# Patient Record
Sex: Male | Born: 2000 | Race: Black or African American | Hispanic: No | Marital: Single | Smoking: Never smoker
Health system: Southern US, Community
[De-identification: ages and names within clinical notes are randomized; demographics above are authoritative.]

## PROBLEM LIST (undated history)

## (undated) DIAGNOSIS — E559 Vitamin D deficiency, unspecified: Secondary | ICD-10-CM

## (undated) HISTORY — PX: FOOT SURGERY: SHX648

## (undated) HISTORY — PX: CLUB FOOT RELEASE: SHX1363

## (undated) HISTORY — PX: HERNIA REPAIR: SHX51

---
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
## 2021-05-01 ENCOUNTER — Emergency Department (HOSPITAL_BASED_OUTPATIENT_CLINIC_OR_DEPARTMENT_OTHER)
Admission: EM | Admit: 2021-05-01 | Discharge: 2021-05-01 | Disposition: A | Discharge status: Home / Self Care | Payer: 59 | Attending: Emergency Medicine | Admitting: Emergency Medicine

## 2021-05-01 ENCOUNTER — Other Ambulatory Visit: Payer: Self-pay

## 2021-05-01 ENCOUNTER — Emergency Department (HOSPITAL_BASED_OUTPATIENT_CLINIC_OR_DEPARTMENT_OTHER): Payer: 59 | Admitting: Radiology

## 2021-05-01 ENCOUNTER — Encounter (HOSPITAL_BASED_OUTPATIENT_CLINIC_OR_DEPARTMENT_OTHER): Payer: Self-pay

## 2021-05-01 DIAGNOSIS — M545 Low back pain, unspecified: Secondary | ICD-10-CM | POA: Insufficient documentation

## 2021-05-01 DIAGNOSIS — M546 Pain in thoracic spine: Secondary | ICD-10-CM | POA: Diagnosis not present

## 2021-05-01 DIAGNOSIS — Y990 Civilian activity done for income or pay: Secondary | ICD-10-CM | POA: Diagnosis not present

## 2021-05-01 DIAGNOSIS — J45909 Unspecified asthma, uncomplicated: Secondary | ICD-10-CM | POA: Diagnosis not present

## 2021-05-01 DIAGNOSIS — W228XXA Striking against or struck by other objects, initial encounter: Secondary | ICD-10-CM | POA: Diagnosis not present

## 2021-05-01 MED ORDER — KETOROLAC TROMETHAMINE 60 MG/2ML IM SOLN
60.0000 mg | Freq: Once | INTRAMUSCULAR | Status: AC
  Administered 2021-05-01: 60 mg via INTRAMUSCULAR
  Filled 2021-05-01: qty 2

## 2021-05-01 NOTE — ED Triage Notes (Signed)
Pt reports hitting his back on a metal pole yesterday. Woke up in the middle of the night with back tightness. Pt reports mid back pain

## 2021-05-01 NOTE — ED Provider Notes (Signed)
[facility] EMERGENCY DEPT Provider Note   CSN: [id number] Arrival date & time: 05/01/21  1827     History Chief Complaint  Patient presents with   Back Pain    Joshua Best is a 20 y.o. male.  The history is provided by the patient.  Back Pain Location:  Thoracic spine Quality:  Aching Radiates to:  Does not radiate Pain severity:  Mild Onset quality:  Gradual Duration:  1 day Timing:  Intermittent Progression:  Waxing and waning Chronicity:  New Context comment:  Hit back on a metal pole at work yesterday Relieved by:  Nothing Worsened by:  Nothing Associated symptoms: no abdominal pain, no abdominal swelling, no bladder incontinence, no bowel incontinence, no chest pain, no dysuria, no fever, no headaches, no leg pain, no numbness, no paresthesias, no pelvic pain, no perianal numbness, no tingling, no weakness and no weight loss       Past Medical History:  Diagnosis Date   Asthma    Attention deficit disorder    Vitamin D deficiency     There are no problems to display for this patient.   Past Surgical History:  Procedure Laterality Date   CLUB FOOT RELEASE     FOOT SURGERY     HERNIA REPAIR         Family History  Problem Relation Age of Onset   Healthy Mother    Sickle cell trait Father    Healthy Father     Social History   Tobacco Use   Smoking status: Never   Smokeless tobacco: Never  Vaping Use   Vaping Use: Never used  Substance Use Topics   Alcohol use: No   Drug use: No    Home Medications Prior to Admission medications   Not on File    Allergies    Patient has no known allergies.  Review of Systems   Review of Systems  Constitutional:  Negative for fever and weight loss.  Cardiovascular:  Negative for chest pain.  Gastrointestinal:  Negative for abdominal pain and bowel incontinence.  Genitourinary:  Negative for bladder incontinence, dysuria and pelvic pain.  Musculoskeletal:  Positive for back pain.  Negative for arthralgias.  Skin:  Negative for color change and pallor.  Neurological:  Negative for dizziness, tingling, tremors, seizures, syncope, facial asymmetry, speech difficulty, weakness, light-headedness, numbness, headaches and paresthesias.   Physical Exam Updated Vital Signs BP (!) 143/93 (BP Location: Right Arm)   Pulse 87   Temp 98.6 F (37 C) (Oral)   Resp 16   SpO2 98%   Physical Exam Vitals and nursing note reviewed.  Constitutional:      General: He is not in acute distress.    Appearance: He is well-developed. He is not ill-appearing.  HENT:     Head: Normocephalic and atraumatic.  Cardiovascular:     Rate and Rhythm: Normal rate and regular rhythm.     Pulses: Normal pulses.     Heart sounds: No murmur heard. Pulmonary:     Effort: Pulmonary effort is normal. No respiratory distress.     Breath sounds: Normal breath sounds.  Abdominal:     Palpations: Abdomen is soft.     Tenderness: There is no abdominal tenderness.  Musculoskeletal:        General: Tenderness (TTP to parapsinal mid thoracic muscles, no midline spinal tenderness) present. No swelling.     Cervical back: Normal range of motion and neck supple. No tenderness.  Skin:  General: Skin is warm and dry.     Capillary Refill: Capillary refill takes less than 2 seconds.  Neurological:     General: No focal deficit present.     Mental Status: He is alert and oriented to person, place, and time.     Cranial Nerves: No cranial nerve deficit.     Sensory: No sensory deficit.     Motor: No weakness.    ED Results / Procedures / Treatments   Labs (all labs ordered are listed, but only abnormal results are displayed) Labs Reviewed - No data to display  EKG None  Radiology DG Thoracic Spine 2 View  Result Date: 05/01/2021 CLINICAL DATA:  Pain.  Hit back no evidence on metal pole yesterday. EXAM: THORACIC SPINE 2 VIEWS COMPARISON:  None. FINDINGS: The alignment is maintained. Vertebral  body heights are maintained. Of fracture or focal bone abnormality. No significant disc space narrowing. Posterior elements appear intact. There is no paravertebral soft tissue abnormality. IMPRESSION: Negative radiographs of the thoracic spine. Electronically Signed   By: Narda Rutherford M.D.   On: 05/01/2021 19:23    Procedures Procedures   Medications Ordered in ED Medications  ketorolac (TORADOL) injection 60 mg (has no administration in time range)    ED Course  I have reviewed the triage vital signs and the nursing notes.  Pertinent labs & imaging results that were available during my care of the patient were reviewed by me and considered in my medical decision making (see chart for details).    MDM Rules/Calculators/A&P                           [name] is here with mid back pain after hitting his back on a metal pole at work.  X-ray negative for fracture.  This seems to maybe be a muscle contusion or spasm.  Recommend Tylenol and ibuprofen.  Very well-appearing.  No midline spinal pain.  Discharged in good condition.  This chart was dictated using voice recognition software.  Despite best efforts to proofread,  errors can occur which can change the documentation meaning.   Final Clinical Impression(s) / ED Diagnoses Final diagnoses:  Acute low back pain without sciatica, unspecified back pain laterality    Rx / DC Orders ED Discharge Orders     None        Virgina Norfolk, DO 05/01/21 1928

## 2021-05-01 NOTE — Discharge Instructions (Signed)
X-ray showed no fracture.  Overall suspect a bone bruise.  Recommend taking 1000 mg of Tylenol every 6 hours as needed for pain.  Recommend taking 800 mg ibuprofen every 8 hours as needed for pain

## 2021-05-14 ENCOUNTER — Other Ambulatory Visit: Payer: Self-pay

## 2021-05-14 ENCOUNTER — Encounter (HOSPITAL_BASED_OUTPATIENT_CLINIC_OR_DEPARTMENT_OTHER): Payer: Self-pay | Admitting: Emergency Medicine

## 2021-05-14 ENCOUNTER — Emergency Department (HOSPITAL_BASED_OUTPATIENT_CLINIC_OR_DEPARTMENT_OTHER)
Admission: EM | Admit: 2021-05-14 | Discharge: 2021-05-14 | Disposition: A | Discharge status: Home / Self Care | Payer: 59 | Attending: Emergency Medicine | Admitting: Emergency Medicine

## 2021-05-14 DIAGNOSIS — R109 Unspecified abdominal pain: Secondary | ICD-10-CM | POA: Insufficient documentation

## 2021-05-14 DIAGNOSIS — J45909 Unspecified asthma, uncomplicated: Secondary | ICD-10-CM | POA: Diagnosis not present

## 2021-05-14 DIAGNOSIS — R531 Weakness: Secondary | ICD-10-CM | POA: Insufficient documentation

## 2021-05-14 DIAGNOSIS — R1110 Vomiting, unspecified: Secondary | ICD-10-CM | POA: Diagnosis not present

## 2021-05-14 DIAGNOSIS — K2900 Acute gastritis without bleeding: Secondary | ICD-10-CM

## 2021-05-14 MED ORDER — ONDANSETRON 4 MG PO TBDP: 4.0000 mg | ORAL_TABLET | Freq: Three times a day (TID) | ORAL | 0 refills | Status: DC | PRN

## 2021-05-14 MED ORDER — ONDANSETRON 4 MG PO TBDP
4.0000 mg | ORAL_TABLET | Freq: Once | ORAL | Status: AC
  Administered 2021-05-14: 4 mg via ORAL
  Filled 2021-05-14: qty 1

## 2021-05-14 NOTE — ED Provider Notes (Signed)
[facility] EMERGENCY DEPARTMENT Provider Note  CSN: [id number] Arrival date & time: 05/14/21 1821    History Chief Complaint  Patient presents with  . Emesis    Joshua Best is a 20 y.o. male who works at [employer], reports he ate two sausage biscuits around lunchtime yesterday and began having some abdominal discomfort shortly after. He went home to take a nap and woke up vomiting around midnight last night. He reports continued vomiting through the night but improved during the day today. He has not tried eating or drinking much and still feels generally weak. No more abdominal pain. No fever or diarrhea.    Past Medical History:  Diagnosis Date  . Asthma   . Attention deficit disorder   . Vitamin D deficiency     Past Surgical History:  Procedure Laterality Date  . CLUB FOOT RELEASE    . FOOT SURGERY    . HERNIA REPAIR      Family History  Problem Relation Age of Onset  . Healthy Mother   . Sickle cell trait Father   . Healthy Father     Social History   Tobacco Use  . Smoking status: Never  . Smokeless tobacco: Never  Vaping Use  . Vaping Use: Never used  Substance Use Topics  . Alcohol use: No  . Drug use: No     Home Medications Prior to Admission medications   Not on File     Allergies    Patient has no known allergies.   Review of Systems   Review of Systems A comprehensive review of systems was completed and negative except as noted in HPI.     Physical Exam BP 123/63 (BP Location: Right Arm)   Pulse (!) 58   Temp 98.4 F (36.9 C)   Resp 16   Ht 5\' 8"  (1.727 m)   Wt 86.2 kg   SpO2 100%   BMI 28.89 kg/m   Physical Exam Vitals and nursing note reviewed.  Constitutional:      Appearance: Normal appearance.  HENT:     Head: Normocephalic and atraumatic.     Nose: Nose normal.     Mouth/Throat:     Mouth: Mucous membranes are moist.  Eyes:     Extraocular Movements: Extraocular movements intact.      Conjunctiva/sclera: Conjunctivae normal.  Cardiovascular:     Rate and Rhythm: Normal rate.  Pulmonary:     Effort: Pulmonary effort is normal.     Breath sounds: Normal breath sounds.  Abdominal:     General: Abdomen is flat.     Palpations: Abdomen is soft.     Tenderness: There is no abdominal tenderness.  Musculoskeletal:        General: No swelling. Normal range of motion.     Cervical back: Neck supple.  Skin:    General: Skin is warm and dry.  Neurological:     General: No focal deficit present.     Mental Status: He is alert.  Psychiatric:        Mood and Affect: Mood normal.     ED Results / Procedures / Treatments   Labs (all labs ordered are listed, but only abnormal results are displayed) Labs Reviewed - No data to display  EKG None   Radiology No results found.  Procedures Procedures  Medications Ordered in the ED Medications  ondansetron (ZOFRAN-ODT) disintegrating tablet 4 mg (4 mg Oral Given 05/14/21 2200)     MDM  Rules/Calculators/A&P MDM Patient with benign exam, not currently vomiting. Will give Zofran and PO trial.   ED Course  I have reviewed the triage vital signs and the nursing notes.  Pertinent labs & imaging results that were available during my care of the patient were reviewed by me and considered in my medical decision making (see chart for details).  Clinical Course as of 05/14/21 2324  Mon May 14, 2021  2251 Patient feeling better and wants to go home. Rx for Zofran, work note and PCP follow up.   [CS]    Clinical Course User Index [CS] Pollyann Savoy, MD    Final Clinical Impression(s) / ED Diagnoses Final diagnoses:  None    Rx / DC Orders ED Discharge Orders     None        Pollyann Savoy, MD 05/14/21 2324

## 2021-05-14 NOTE — ED Triage Notes (Incomplete)
Pt arrive sto ED with c/o emesis. Pt reports that he ate two sausage biscuits yesterday afternoon and just two hours later started throwing up. Pt reports he has vomited five times since yesterday afternoon. He states his stomach is "queezy" but not painful. Pt denies diarrhea. No fevers, chills. Vomit is brown tinged.

## 2021-06-12 ENCOUNTER — Ambulatory Visit: Payer: Medicaid Other | Admitting: Physician Assistant

## 2021-07-03 ENCOUNTER — Ambulatory Visit: Payer: Medicaid Other | Admitting: Physician Assistant

## 2021-08-09 ENCOUNTER — Ambulatory Visit: Payer: Medicaid Other | Admitting: Physician Assistant

## 2022-02-03 ENCOUNTER — Ambulatory Visit
Admission: EM | Admit: 2022-02-03 | Discharge: 2022-02-03 | Disposition: A | Discharge status: Home / Self Care | Payer: 59 | Attending: Internal Medicine | Admitting: Internal Medicine

## 2022-02-03 ENCOUNTER — Encounter: Payer: Self-pay | Admitting: Emergency Medicine

## 2022-02-03 DIAGNOSIS — J069 Acute upper respiratory infection, unspecified: Secondary | ICD-10-CM | POA: Diagnosis present

## 2022-02-03 DIAGNOSIS — H65191 Other acute nonsuppurative otitis media, right ear: Secondary | ICD-10-CM | POA: Diagnosis present

## 2022-02-03 DIAGNOSIS — Z20822 Contact with and (suspected) exposure to covid-19: Secondary | ICD-10-CM | POA: Insufficient documentation

## 2022-02-03 LAB — SARS CORONAVIRUS 2 BY RT PCR: SARS Coronavirus 2 by RT PCR: NEGATIVE

## 2022-02-03 MED ORDER — AMOXICILLIN 875 MG PO TABS: 875.0000 mg | ORAL_TABLET | Freq: Two times a day (BID) | ORAL | 0 refills | Status: AC

## 2022-02-03 NOTE — Discharge Instructions (Signed)
It appears that you have a viral upper respiratory infection that should run its course and self resolve with symptomatic treatment as we discussed.  You also have an ear infection which is being treated with antibiotic.  Follow-up if symptoms persist or worsen.

## 2022-02-03 NOTE — ED Triage Notes (Signed)
Pt is present today with c/o cough and nasal congestion.Pt sx started x2 days ago  

## 2022-02-03 NOTE — ED Provider Notes (Signed)
[facility]    CSN: [id number] Arrival date & time: 02/03/22  1332      History   Chief Complaint Chief Complaint  Patient presents with   Cough   Nasal Congestion    HPI Joshua Best is a 21 y.o. male.   Patient presents with cough, nasal congestion, sore throat that started about 2 days ago.  His son who is present in exam room has had similar symptoms.  Denies any known fevers.  Denies chest pain, shortness of breath, nausea, vomiting, diarrhea, abdominal pain.  Patient has taken Tylenol for symptoms with minimal improvement.   Cough   Past Medical History:  Diagnosis Date   Asthma    Attention deficit disorder    Vitamin D deficiency     There are no problems to display for this patient.   Past Surgical History:  Procedure Laterality Date   CLUB FOOT RELEASE     FOOT SURGERY     HERNIA REPAIR         Home Medications    Prior to Admission medications   Medication Sig Start Date End Date Taking? Authorizing Provider  amoxicillin (AMOXIL) 875 MG tablet Take 1 tablet (875 mg total) by mouth 2 (two) times daily for 10 days. 02/03/22 02/13/22 Yes Tylyn Stankovich, Michele Rockers, FNP  ondansetron (ZOFRAN-ODT) 4 MG disintegrating tablet Take 1 tablet (4 mg total) by mouth every 8 (eight) hours as needed for nausea or vomiting. 05/14/21   Truddie Hidden, MD    Family History Family History  Problem Relation Age of Onset   Healthy Mother    Sickle cell trait Father    Healthy Father     Social History Social History   Tobacco Use   Smoking status: Never   Smokeless tobacco: Never  Vaping Use   Vaping Use: Never used  Substance Use Topics   Alcohol use: No   Drug use: No     Allergies   Patient has no known allergies.   Review of Systems Review of Systems Per HPI  Physical Exam Triage Vital Signs ED Triage Vitals [02/03/22 1402]  Enc Vitals Group     BP 134/78     Pulse Rate 84     Resp 18     Temp 98.1 F (36.7 C)     Temp src       SpO2 97 %     Weight      Height      Head Circumference      Peak Flow      Pain Score 0     Pain Loc      Pain Edu?      Excl. in [place]?    No data found.  Updated Vital Signs BP 134/78   Pulse 84   Temp 98.1 F (36.7 C)   Resp 18   SpO2 97%   Visual Acuity Right Eye Distance:   Left Eye Distance:   Bilateral Distance:    Right Eye Near:   Left Eye Near:    Bilateral Near:     Physical Exam Constitutional:      General: He is not in acute distress.    Appearance: Normal appearance. He is not toxic-appearing or diaphoretic.  HENT:     Head: Normocephalic and atraumatic.     Right Ear: Ear canal normal. Tympanic membrane is erythematous and bulging. Tympanic membrane is not perforated.     Left Ear: Tympanic membrane and  ear canal normal.     Nose: Congestion present.     Mouth/Throat:     Mouth: Mucous membranes are moist.     Pharynx: No posterior oropharyngeal erythema.  Eyes:     Extraocular Movements: Extraocular movements intact.     Conjunctiva/sclera: Conjunctivae normal.     Pupils: Pupils are equal, round, and reactive to light.  Cardiovascular:     Rate and Rhythm: Normal rate and regular rhythm.     Pulses: Normal pulses.     Heart sounds: Normal heart sounds.  Pulmonary:     Effort: Pulmonary effort is normal. No respiratory distress.     Breath sounds: Normal breath sounds. No stridor. No wheezing, rhonchi or rales.  Abdominal:     General: Abdomen is flat. Bowel sounds are normal.     Palpations: Abdomen is soft.  Musculoskeletal:        General: Normal range of motion.     Cervical back: Normal range of motion.  Skin:    General: Skin is warm and dry.  Neurological:     General: No focal deficit present.     Mental Status: He is alert and oriented to person, place, and time. Mental status is at baseline.  Psychiatric:        Mood and Affect: Mood normal.        Behavior: Behavior normal.      UC Treatments / Results  Labs (all labs  ordered are listed, but only abnormal results are displayed) Labs Reviewed  SARS CORONAVIRUS 2 BY RT PCR    EKG   Radiology No results found.  Procedures Procedures (including critical care time)  Medications Ordered in UC Medications - No data to display  Initial Impression / Assessment and Plan / UC Course  I have reviewed the triage vital signs and the nursing notes.  Pertinent labs & imaging results that were available during my care of the patient were reviewed by me and considered in my medical decision making (see chart for details).     Patient presents with symptoms likely from a viral upper respiratory infection. Differential includes bacterial pneumonia, sinusitis, allergic rhinitis, COVID-19, flu. Do not suspect underlying cardiopulmonary process. Symptoms seem unlikely related to ACS, CHF or COPD exacerbations, pneumonia, pneumothorax. Patient is nontoxic appearing and not in need of emergent medical intervention.  COVID test pending.  Do not think strep testing is indicated given appearance of posterior pharynx on exam.  Recommended symptom control with over the counter medications.  Amoxicillin to treat right ear infection.  Return if symptoms fail to improve in 1-2 weeks or you develop shortness of breath, chest pain, severe headache. Patient states understanding and is agreeable.  Discharged with PCP followup.  Final Clinical Impressions(s) / UC Diagnoses   Final diagnoses:  Viral upper respiratory tract infection with cough  Other non-recurrent acute nonsuppurative otitis media of right ear     Discharge Instructions      It appears that you have a viral upper respiratory infection that should run its course and self resolve with symptomatic treatment as we discussed.  You also have an ear infection which is being treated with antibiotic.  Follow-up if symptoms persist or worsen.    ED Prescriptions     Medication Sig Dispense Auth. Provider    amoxicillin (AMOXIL) 875 MG tablet Take 1 tablet (875 mg total) by mouth 2 (two) times daily for 10 days. 20 tablet [place], Acie Fredrickson, [place]  PDMP not reviewed this encounter.   Gustavus Bryant, [place] 02/03/22 1440

## 2022-09-30 IMAGING — DX DG THORACIC SPINE 2V
3 series · 4 of 4 positions shown · non-contrast
Comparison: None.

CLINICAL DATA: Pain.  Hit back no evidence on metal pole yesterday.

EXAM:
THORACIC SPINE 2 VIEWS

[Series 1: t-spine ap · 0.13mm/px · 2 of 2 slices shown]
[im 1/2]
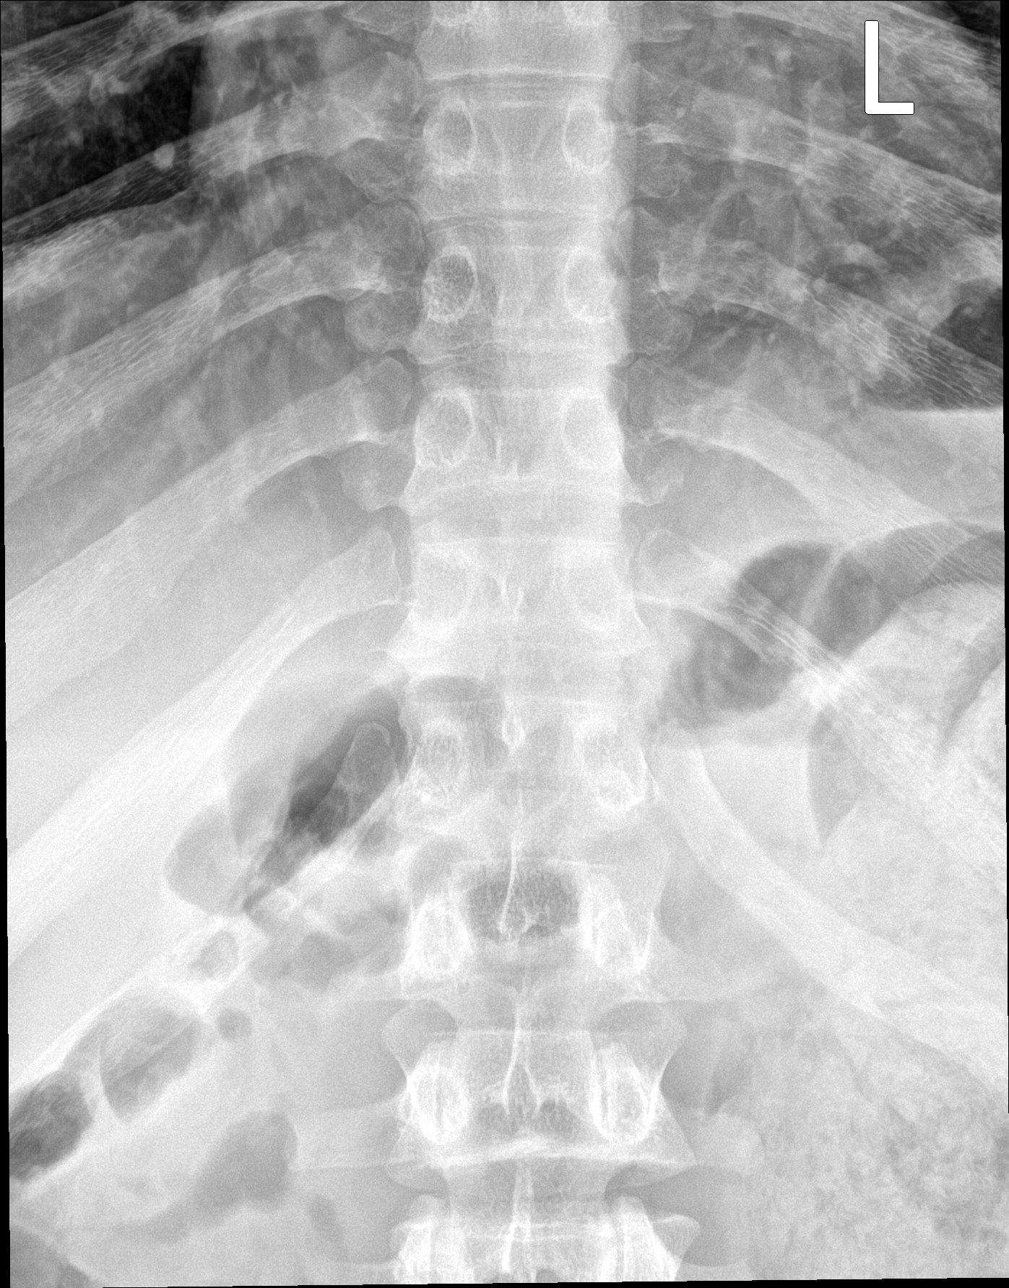
[im 2/2]
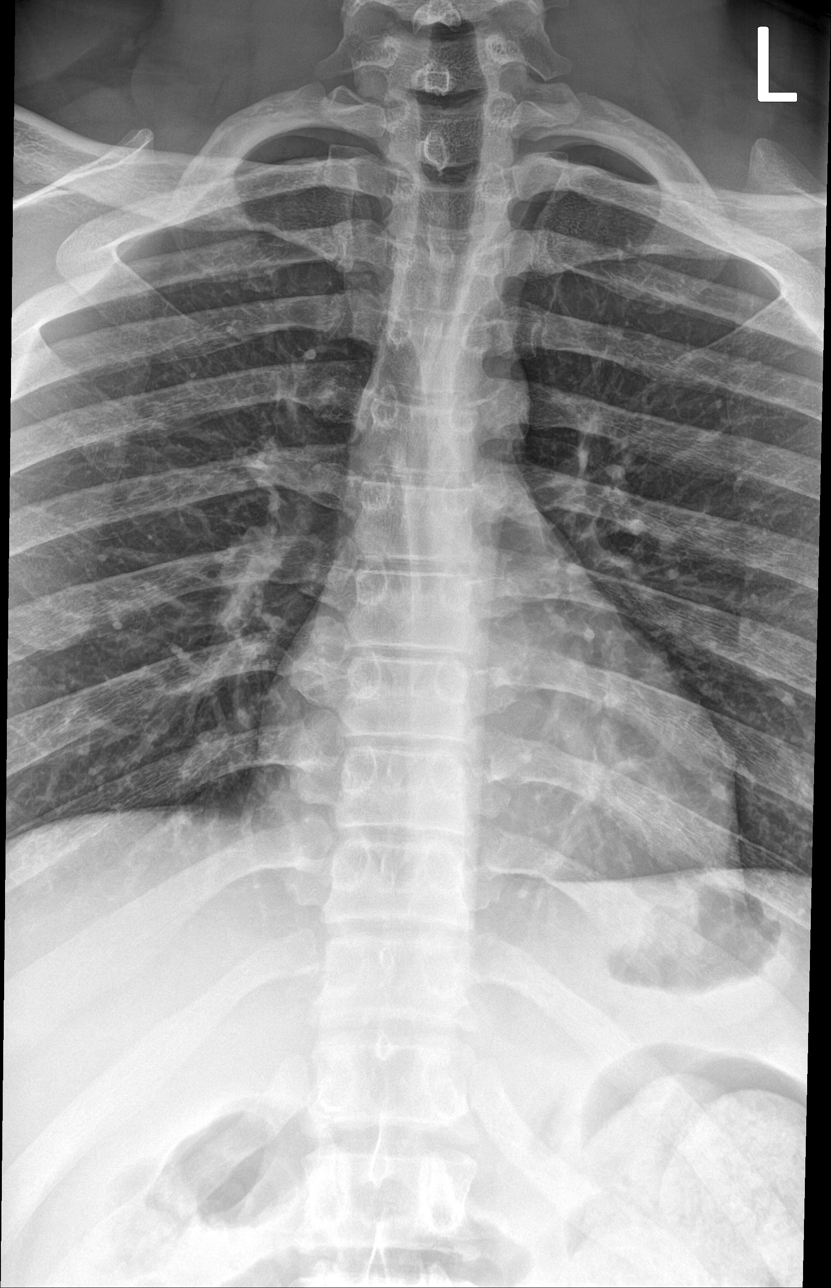

[t-spine lat]
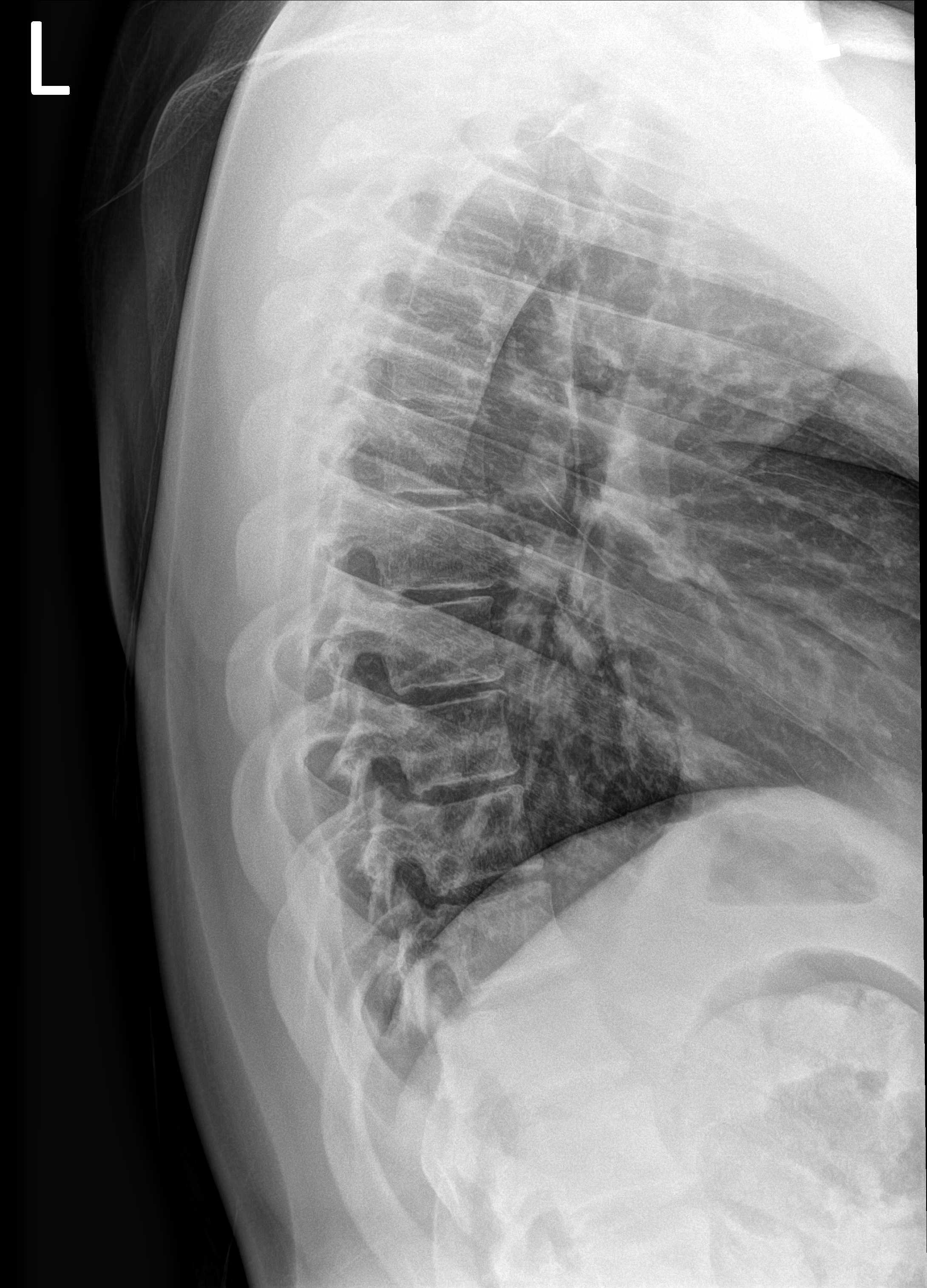

[ct-spine swimmers]
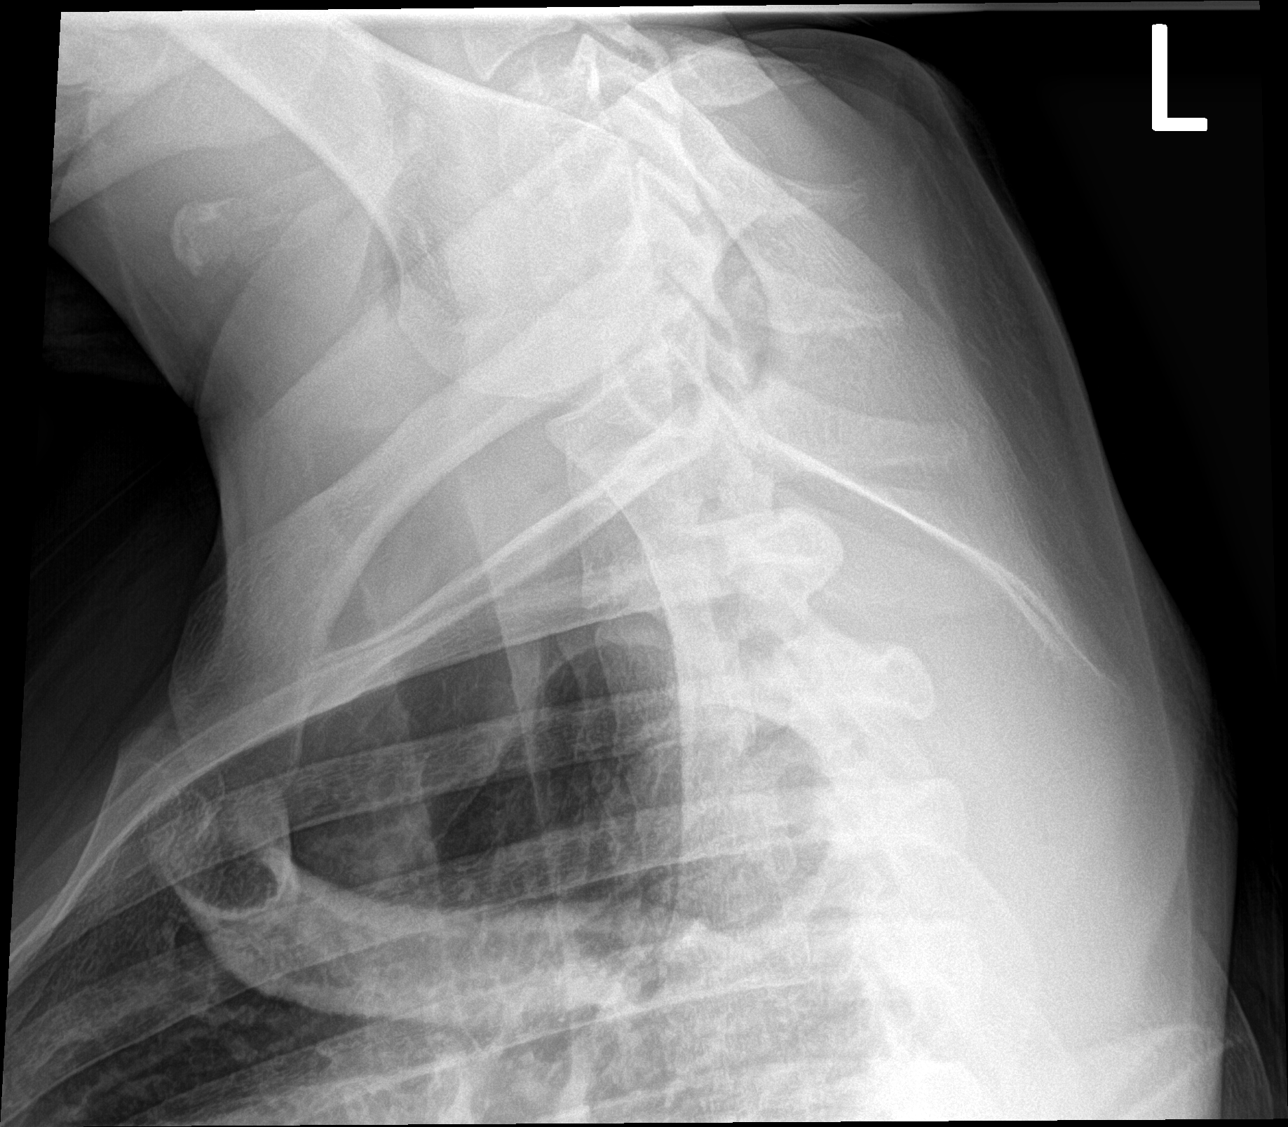

[4 of 4 positions shown; findings below may reference images not displayed]

FINDINGS: The alignment is maintained. Vertebral body heights are maintained.
Of fracture or focal bone abnormality. No significant disc space
narrowing. Posterior elements appear intact. There is no
paravertebral soft tissue abnormality.
IMPRESSION: Negative radiographs of the thoracic spine.

## 2022-11-30 ENCOUNTER — Emergency Department (HOSPITAL_COMMUNITY)
Admission: EM | Admit: 2022-11-30 | Discharge: 2022-12-01 | Disposition: A | Discharge status: Home / Self Care | Payer: 59 | Attending: Emergency Medicine | Admitting: Emergency Medicine

## 2022-11-30 ENCOUNTER — Encounter (HOSPITAL_COMMUNITY): Payer: Self-pay

## 2022-11-30 ENCOUNTER — Other Ambulatory Visit: Payer: Self-pay

## 2022-11-30 DIAGNOSIS — X131XXA Other contact with steam and other hot vapors, initial encounter: Secondary | ICD-10-CM | POA: Insufficient documentation

## 2022-11-30 DIAGNOSIS — T2069XA Corrosion of second degree of multiple sites of head, face, and neck, initial encounter: Secondary | ICD-10-CM | POA: Insufficient documentation

## 2022-11-30 DIAGNOSIS — T535X1A Toxic effect of chlorofluorocarbons, accidental (unintentional), initial encounter: Secondary | ICD-10-CM | POA: Insufficient documentation

## 2022-11-30 DIAGNOSIS — T304 Corrosion of unspecified body region, unspecified degree: Secondary | ICD-10-CM

## 2022-11-30 DIAGNOSIS — Y929 Unspecified place or not applicable: Secondary | ICD-10-CM | POA: Insufficient documentation

## 2022-11-30 DIAGNOSIS — T22611A Corrosion of second degree of right forearm, initial encounter: Secondary | ICD-10-CM | POA: Insufficient documentation

## 2022-11-30 DIAGNOSIS — Z23 Encounter for immunization: Secondary | ICD-10-CM | POA: Insufficient documentation

## 2022-11-30 NOTE — ED Triage Notes (Signed)
Pt had a cooler line break on him having the refrigerant go on his rt arm and rt face. Has a blister on the lateral side of rt eye

## 2022-12-01 MED ORDER — TETANUS-DIPHTH-ACELL PERTUSSIS 5-2.5-18.5 LF-MCG/0.5 IM SUSY
0.5000 mL | PREFILLED_SYRINGE | Freq: Once | INTRAMUSCULAR | Status: AC
  Administered 2022-12-01: 0.5 mL via INTRAMUSCULAR
  Filled 2022-12-01: qty 0.5

## 2022-12-01 MED ORDER — IBUPROFEN 800 MG PO TABS
800.0000 mg | ORAL_TABLET | Freq: Once | ORAL | Status: AC
  Administered 2022-12-01: 800 mg via ORAL
  Filled 2022-12-01: qty 1

## 2022-12-01 MED ORDER — BACITRACIN ZINC 500 UNIT/GM EX OINT
TOPICAL_OINTMENT | Freq: Once | CUTANEOUS | Status: AC
  Administered 2022-12-01: 1 via TOPICAL
  Filled 2022-12-01: qty 0.9

## 2022-12-01 NOTE — ED Notes (Signed)
Patient verbalizes understanding of discharge instructions. Opportunity for questioning and answers were provided. Armband removed by staff, pt discharged from ED. Pt ambulatory to ED waiting room with steady gait.  
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
## 2023-02-06 ENCOUNTER — Ambulatory Visit
Admission: EM | Admit: 2023-02-06 | Discharge: 2023-02-06 | Disposition: A | Discharge status: Home / Self Care | Payer: 59 | Attending: Internal Medicine | Admitting: Internal Medicine

## 2023-02-06 DIAGNOSIS — M79641 Pain in right hand: Secondary | ICD-10-CM

## 2023-02-06 DIAGNOSIS — M79642 Pain in left hand: Secondary | ICD-10-CM

## 2023-02-06 MED ORDER — PREDNISONE 20 MG PO TABS: 40.0000 mg | ORAL_TABLET | Freq: Every day | ORAL | 0 refills | Status: AC

## 2023-02-06 NOTE — ED Provider Notes (Signed)
[facility]    CSN: [id number] Arrival date & time: 02/06/23  4098      History   Chief Complaint Chief Complaint  Patient presents with   Hand Pain   Finger Injury    HPI Joshua Best is a 22 y.o. male.   Patient presents with approximately 2 to 41-month history of bilateral hand pain.  Reports that he mainly has pain throughout the MCP joints of the right 1st through 3rd digits and left fourth and fifth digits.  Denies any injury to the area.  Reports is the first time he has been evaluated for this.  He has taken Tylenol and ibuprofen with no improvement.  Denies numbness or tingling. Denies repetitive movements of the hands.    Hand Pain    Past Medical History:  Diagnosis Date   Asthma    Attention deficit disorder    Vitamin D deficiency     There are no problems to display for this patient.   Past Surgical History:  Procedure Laterality Date   CLUB FOOT RELEASE     FOOT SURGERY     HERNIA REPAIR         Home Medications    Prior to Admission medications   Medication Sig Start Date End Date Taking? Authorizing Provider  predniSONE (DELTASONE) 20 MG tablet Take 2 tablets (40 mg total) by mouth daily for 5 days. 02/06/23 02/11/23 Yes Izek Corvino, Acie Fredrickson, FNP  ondansetron (ZOFRAN-ODT) 4 MG disintegrating tablet Take 1 tablet (4 mg total) by mouth every 8 (eight) hours as needed for nausea or vomiting. 05/14/21   Pollyann Savoy, MD    Family History Family History  Problem Relation Age of Onset   Healthy Mother    Sickle cell trait Father    Healthy Father     Social History Social History   Tobacco Use   Smoking status: Never   Smokeless tobacco: Never  Vaping Use   Vaping status: Never Used  Substance Use Topics   Alcohol use: No   Drug use: No     Allergies   Patient has no known allergies.   Review of Systems Review of Systems Per HPI  Physical Exam Triage Vital Signs ED Triage Vitals [02/06/23 0901]  Encounter  Vitals Group     BP 128/81     Systolic BP Percentile      Diastolic BP Percentile      Pulse Rate 80     Resp 16     Temp 98.7 F (37.1 C)     Temp Source Oral     SpO2 98 %     Weight      Height      Head Circumference      Peak Flow      Pain Score      Pain Loc      Pain Education      Exclude from Growth Chart    No data found.  Updated Vital Signs BP 128/81 (BP Location: Left Arm)   Pulse 80   Temp 98.7 F (37.1 C) (Oral)   Resp 16   SpO2 98%   Visual Acuity Right Eye Distance:   Left Eye Distance:   Bilateral Distance:    Right Eye Near:   Left Eye Near:    Bilateral Near:     Physical Exam Constitutional:      General: He is not in acute distress.    Appearance: Normal appearance.  He is not toxic-appearing or diaphoretic.  HENT:     Head: Normocephalic and atraumatic.  Eyes:     Extraocular Movements: Extraocular movements intact.     Conjunctiva/sclera: Conjunctivae normal.  Pulmonary:     Effort: Pulmonary effort is normal.  Musculoskeletal:     Comments: Tenderness to palpation overlying the 1st through 3rd MCP joints of the right hand and fourth and fifth MCP joints of the left hand.  No other tenderness to palpation to hand or wrist or fingers.  Full range of motion of fingers present with grip strength 5/5 bilaterally.  Negative Tinel's and Phalen sign.  Capillary refill and pulses intact.  No swelling present.  Neurological:     General: No focal deficit present.     Mental Status: He is alert and oriented to person, place, and time. Mental status is at baseline.  Psychiatric:        Mood and Affect: Mood normal.        Behavior: Behavior normal.        Thought Content: Thought content normal.        Judgment: Judgment normal.      UC Treatments / Results  Labs (all labs ordered are listed, but only abnormal results are displayed) Labs Reviewed - No data to display  EKG   Radiology No results found.  Procedures Procedures  (including critical care time)  Medications Ordered in UC Medications - No data to display  Initial Impression / Assessment and Plan / UC Course  I have reviewed the triage vital signs and the nursing notes.  Pertinent labs & imaging results that were available during my care of the patient were reviewed by me and considered in my medical decision making (see chart for details).     Differential diagnoses include carpal tunnel syndrome versus tendinitis versus osteoarthritis.  Do not think x-ray imaging is necessary given no direct injury.  Will prescribe prednisone to decrease inflammation given NSAIDs and Tylenol have not been helpful.  Advised strict follow-up with hand specialty if symptoms persist or worsen despite current treatment plan.  Patient verbalized understanding and was agreeable with plan. Final Clinical Impressions(s) / UC Diagnoses   Final diagnoses:  Bilateral hand pain     Discharge Instructions      I have prescribed prednisone to help alleviate bilateral hand pain and inflammation.  Follow-up with hand specialist if symptoms persist or worsen.    ED Prescriptions     Medication Sig Dispense Auth. Provider   predniSONE (DELTASONE) 20 MG tablet Take 2 tablets (40 mg total) by mouth daily for 5 days. 10 tablet Gustavus Bryant, [place]      PDMP not reviewed this encounter.   Gustavus Bryant, [place] 02/06/23 [phone]

## 2023-02-06 NOTE — ED Triage Notes (Signed)
Pt reports bilateral finger and hand pain for several weeks. Pt reports fingers do not feel numb. Denies any injuries to his hands.

## 2023-02-06 NOTE — Discharge Instructions (Signed)
I have prescribed prednisone to help alleviate bilateral hand pain and inflammation.  Follow-up with hand specialist if symptoms persist or worsen.

## 2023-12-01 ENCOUNTER — Encounter: Payer: Self-pay | Admitting: Emergency Medicine

## 2023-12-01 ENCOUNTER — Ambulatory Visit
Admission: EM | Admit: 2023-12-01 | Discharge: 2023-12-01 | Disposition: A | Discharge status: Home / Self Care | Attending: Family Medicine | Admitting: Family Medicine

## 2023-12-01 DIAGNOSIS — K529 Noninfective gastroenteritis and colitis, unspecified: Secondary | ICD-10-CM

## 2023-12-01 MED ORDER — ONDANSETRON 4 MG PO TBDP
4.0000 mg | ORAL_TABLET | Freq: Once | ORAL | Status: AC
  Administered 2023-12-01: 4 mg via ORAL

## 2023-12-01 MED ORDER — ONDANSETRON 4 MG PO TBDP: 4.0000 mg | ORAL_TABLET | Freq: Three times a day (TID) | ORAL | 0 refills | Status: DC | PRN

## 2023-12-01 NOTE — Discharge Instructions (Signed)
 Ondansetron  dissolved in the mouth every 8 hours as needed for nausea or vomiting. Clear liquids(water, gatorade/pedialyte, ginger ale/sprite, chicken broth/soup) and bland things(crackers/toast, rice, potato, bananas) to eat. Avoid acidic foods like lemon/lime/orange/tomato, and avoid greasy/spicy foods.  We gave you 1 dose of this medicine in the office.

## 2023-12-01 NOTE — ED Triage Notes (Signed)
 Pt st's he vomited at work this am and was sent home.  Pt st's he vomited x's 3-4 .  Pt continues to have nausea

## 2023-12-01 NOTE — ED Provider Notes (Signed)
 [facility]    CSN: [id number] Arrival date & time: 12/01/23  1346      History   Chief Complaint Chief Complaint  Patient presents with   Emesis    HPI Joshua Best is a 23 y.o. male.    Emesis Here for nausea and vomiting.  Symptoms began early this morning.  He is thrown up about 3 or 4 times.  No diarrhea so far.  Last bowel movement was about 2 days ago.  No fever or chills  No upper respiratory symptoms.  NKDA   Past Medical History:  Diagnosis Date   Asthma    Attention deficit disorder    Vitamin D deficiency     There are no active problems to display for this patient.   Past Surgical History:  Procedure Laterality Date   CLUB FOOT RELEASE     FOOT SURGERY     HERNIA REPAIR         Home Medications    Prior to Admission medications   Medication Sig Start Date End Date Taking? Authorizing Provider  ondansetron  (ZOFRAN -ODT) 4 MG disintegrating tablet Take 1 tablet (4 mg total) by mouth every 8 (eight) hours as needed for nausea or vomiting. 12/01/23  Yes Delaynie Stetzer, Sharlet POUR, MD    Family History Family History  Problem Relation Age of Onset   Healthy Mother    Sickle cell trait Father    Healthy Father     Social History Social History   Tobacco Use   Smoking status: Never   Smokeless tobacco: Never  Vaping Use   Vaping status: Some Days  Substance Use Topics   Alcohol use: Yes    Comment: social   Drug use: No     Allergies   Patient has no known allergies.   Review of Systems Review of Systems  Gastrointestinal:  Positive for vomiting.     Physical Exam Triage Vital Signs ED Triage Vitals  Encounter Vitals Group     BP 12/01/23 1411 120/78     Girls Systolic BP Percentile --      Girls Diastolic BP Percentile --      Boys Systolic BP Percentile --      Boys Diastolic BP Percentile --      Pulse Rate 12/01/23 1411 78     Resp 12/01/23 1411 16     Temp 12/01/23 1411 98.4 F (36.9 C)     Temp  Source 12/01/23 1411 Oral     SpO2 12/01/23 1411 97 %     Weight --      Height --      Head Circumference --      Peak Flow --      Pain Score 12/01/23 1412 0     Pain Loc --      Pain Education --      Exclude from Growth Chart --    No data found.  Updated Vital Signs BP 120/78 (BP Location: Left Arm)   Pulse 78   Temp 98.4 F (36.9 C) (Oral)   Resp 16   SpO2 97%   Visual Acuity Right Eye Distance:   Left Eye Distance:   Bilateral Distance:    Right Eye Near:   Left Eye Near:    Bilateral Near:     Physical Exam Vitals reviewed.  Constitutional:      General: He is not in acute distress.    Appearance: He is not ill-appearing, toxic-appearing or  diaphoretic.  HENT:     Mouth/Throat:     Mouth: Mucous membranes are moist.     Pharynx: No oropharyngeal exudate or posterior oropharyngeal erythema.   Eyes:     Extraocular Movements: Extraocular movements intact.     Conjunctiva/sclera: Conjunctivae normal.     Pupils: Pupils are equal, round, and reactive to light.    Cardiovascular:     Rate and Rhythm: Normal rate and regular rhythm.     Heart sounds: No murmur heard. Pulmonary:     Effort: Pulmonary effort is normal.     Breath sounds: Normal breath sounds.   Musculoskeletal:     Cervical back: Neck supple.  Lymphadenopathy:     Cervical: No cervical adenopathy.   Skin:    Capillary Refill: Capillary refill takes less than 2 seconds.     Coloration: Skin is not jaundiced or pale.   Neurological:     General: No focal deficit present.     Mental Status: He is alert and oriented to person, place, and time.   Psychiatric:        Behavior: Behavior normal.      UC Treatments / Results  Labs (all labs ordered are listed, but only abnormal results are displayed) Labs Reviewed - No data to display  EKG   Radiology No results found.  Procedures Procedures (including critical care time)  Medications Ordered in UC Medications   ondansetron  (ZOFRAN -ODT) disintegrating tablet 4 mg (has no administration in time range)    Initial Impression / Assessment and Plan / UC Course  I have reviewed the triage vital signs and the nursing notes.  Pertinent labs & imaging results that were available during my care of the patient were reviewed by me and considered in my medical decision making (see chart for details).     1 dose of ondansetron  is given here and prescription sent to the pharmacy.  We discussed clear liquids and a bland diet. Final Clinical Impressions(s) / UC Diagnoses   Final diagnoses:  Gastroenteritis     Discharge Instructions      Ondansetron  dissolved in the mouth every 8 hours as needed for nausea or vomiting. Clear liquids(water, gatorade/pedialyte, ginger ale/sprite, chicken broth/soup) and bland things(crackers/toast, rice, potato, bananas) to eat. Avoid acidic foods like lemon/lime/orange/tomato, and avoid greasy/spicy foods.  We gave you 1 dose of this medicine in the office.     ED Prescriptions     Medication Sig Dispense Auth. Provider   ondansetron  (ZOFRAN -ODT) 4 MG disintegrating tablet Take 1 tablet (4 mg total) by mouth every 8 (eight) hours as needed for nausea or vomiting. 10 tablet Vonna Norinne Jeane K, MD      PDMP not reviewed this encounter.   Vonna Sharlet POUR, MD 12/01/23 [phone]

## 2024-03-19 ENCOUNTER — Ambulatory Visit
Admission: EM | Admit: 2024-03-19 | Discharge: 2024-03-19 | Disposition: A | Discharge status: Home / Self Care | Attending: Family Medicine | Admitting: Family Medicine

## 2024-03-19 DIAGNOSIS — M79605 Pain in left leg: Secondary | ICD-10-CM

## 2024-03-19 DIAGNOSIS — R200 Anesthesia of skin: Secondary | ICD-10-CM

## 2024-03-19 DIAGNOSIS — R202 Paresthesia of skin: Secondary | ICD-10-CM | POA: Diagnosis not present

## 2024-03-19 MED ORDER — PREDNISONE 20 MG PO TABS: 40.0000 mg | ORAL_TABLET | Freq: Every day | ORAL | 0 refills | Status: AC

## 2024-03-19 NOTE — ED Notes (Signed)
 Pt making PCP appt with Hughie PCP

## 2024-03-19 NOTE — Discharge Instructions (Signed)
 Take prednisone 20 mg--2 daily for 5 days  You can use the QR code/website at the back of the summary paperwork to schedule yourself a new patient appointment with primary care

## 2024-03-19 NOTE — ED Triage Notes (Signed)
 Patient reports experiencing pain and numbness in the left upper leg, just below the left lower hip, with radiation down the back of the upper thigh to above the knee. The symptoms are recurrent, occurring intermittently, with no history of injury. No PCP.

## 2024-03-19 NOTE — ED Provider Notes (Signed)
 [facility]    CSN: [id number] Arrival date & time: 03/19/24  1204      History   Chief Complaint Chief Complaint  Patient presents with   Leg Problem   Pain    HPI Joshua Best is a 23 y.o. male.   HPI Here for intermittent numbness and tingling and pain in his left lateral thigh.  He first noted it about October 5, when he was at work and it was mostly painful then.  Did hurt to walk on it.  Then it improved and for the most part this is occurred while he is working up on his feet.  It will last for about 15 minutes and then come and go through the day.  It is mostly a numbness and tingling now, though he did feel his leg want to give way due to pain today.  He does also have some back pain.  No trauma or fall No fever NKDA Past Medical History:  Diagnosis Date   Asthma    Attention deficit disorder    Vitamin D deficiency     Patient Active Problem List   Diagnosis Date Noted   Obesity due to excess calories without serious comorbidity with body mass index (BMI) in 95th to 98th percentile for age in pediatric patient 12/30/2016   Poor diet 12/30/2016   Attention deficit hyperactivity disorder (ADHD), combined type 12/26/2016   Encopresis with constipation and overflow incontinence 12/26/2016   Flatulence 12/26/2016    Past Surgical History:  Procedure Laterality Date   CLUB FOOT RELEASE     FOOT SURGERY     HERNIA REPAIR         Home Medications    Prior to Admission medications   Medication Sig Start Date End Date Taking? Authorizing Provider  predniSONE  (DELTASONE ) 20 MG tablet Take 2 tablets (40 mg total) by mouth daily with breakfast for 5 days. 03/19/24 03/24/24 Yes Tomicka Lover, Sharlet POUR, MD    Family History Family History  Problem Relation Age of Onset   Healthy Mother    Sickle cell trait Father    Healthy Father     Social History Social History   Tobacco Use   Smoking status: Never   Smokeless tobacco: Never  Vaping  Use   Vaping status: Some Days   Substances: Nicotine, Flavoring  Substance Use Topics   Alcohol use: Not Currently    Comment: social   Drug use: Not Currently     Allergies   Patient has no known allergies.   Review of Systems Review of Systems   Physical Exam Triage Vital Signs ED Triage Vitals  Encounter Vitals Group     BP 03/19/24 1228 121/72     Girls Systolic BP Percentile --      Girls Diastolic BP Percentile --      Boys Systolic BP Percentile --      Boys Diastolic BP Percentile --      Pulse Rate 03/19/24 1228 73     Resp 03/19/24 1228 18     Temp 03/19/24 1228 98 F (36.7 C)     Temp Source 03/19/24 1228 Oral     SpO2 03/19/24 1228 98 %     Weight 03/19/24 1225 185 lb (83.9 kg)     Height 03/19/24 1225 5' 9 (1.753 m)     Head Circumference --      Peak Flow --      Pain Score 03/19/24 1222 5  Pain Loc --      Pain Education --      Exclude from Growth Chart --    No data found.  Updated Vital Signs BP 121/72 (BP Location: Right Arm)   Pulse 73   Temp 98 F (36.7 C) (Oral)   Resp 18   Ht 5' 9 (1.753 m)   Wt 83.9 kg   SpO2 98%   BMI 27.32 kg/m   Visual Acuity Right Eye Distance:   Left Eye Distance:   Bilateral Distance:    Right Eye Near:   Left Eye Near:    Bilateral Near:     Physical Exam Vitals reviewed.  Constitutional:      General: He is not in acute distress.    Appearance: He is not ill-appearing, toxic-appearing or diaphoretic.  Cardiovascular:     Rate and Rhythm: Normal rate and regular rhythm.     Heart sounds: No murmur heard. Pulmonary:     Effort: Pulmonary effort is normal.     Breath sounds: Normal breath sounds.  Musculoskeletal:        General: No tenderness.  Skin:    Coloration: Skin is not jaundiced or pale.     Findings: No rash.  Neurological:     Mental Status: He is alert and oriented to person, place, and time.     Comments: Possibly decreased light touch sensation of the lateral left  thigh.  Psychiatric:        Behavior: Behavior normal.      UC Treatments / Results  Labs (all labs ordered are listed, but only abnormal results are displayed) Labs Reviewed - No data to display  EKG   Radiology No results found.  Procedures Procedures (including critical care time)  Medications Ordered in UC Medications - No data to display  Initial Impression / Assessment and Plan / UC Course  I have reviewed the triage vital signs and the nursing notes.  Pertinent labs & imaging results that were available during my care of the patient were reviewed by me and considered in my medical decision making (see chart for details).     5-day burst of prednisone  is sent in to help any inflammation that might be in his spine or that might be causing peripheral nerve compression.  I discussed with him that his presentation sounds like possible meralgia paresthetica, but he is not overweight and is not wearing any tight compressive close.  I discussed with him that there is a possibility this is caused by spinal nerve compression.  Staff will help him make a primary care appointment   Final Clinical Impressions(s) / UC Diagnoses   Final diagnoses:  Numbness and tingling of left leg  Left leg pain     Discharge Instructions      Take prednisone  20 mg--2 daily for 5 days  You can use the QR code/website at the back of the summary paperwork to schedule yourself a new patient appointment with primary care       ED Prescriptions     Medication Sig Dispense Auth. Provider   predniSONE  (DELTASONE ) 20 MG tablet Take 2 tablets (40 mg total) by mouth daily with breakfast for 5 days. 10 tablet Vonna Abimbola Aki K, MD      PDMP not reviewed this encounter.   Vonna Sharlet POUR, MD 03/19/24 1311

## 2024-05-10 ENCOUNTER — Telehealth: Payer: Self-pay | Admitting: Pediatrics

## 2024-05-10 ENCOUNTER — Ambulatory Visit

## 2024-05-10 NOTE — Telephone Encounter (Signed)
 Called about missed new patient appt. No answer.
